# Patient Record
Sex: Female | Born: 1940 | Race: White | Hispanic: No
Health system: Midwestern US, Community
[De-identification: ages and names within clinical notes are randomized; demographics above are authoritative.]

## PROBLEM LIST (undated history)

## (undated) DIAGNOSIS — I2699 Other pulmonary embolism without acute cor pulmonale: Secondary | ICD-10-CM

## (undated) DIAGNOSIS — I4891 Unspecified atrial fibrillation: Secondary | ICD-10-CM

## (undated) DIAGNOSIS — N289 Disorder of kidney and ureter, unspecified: Secondary | ICD-10-CM

## (undated) HISTORY — PX: CHOLECYSTECTOMY: SHX55

## (undated) HISTORY — PX: APPENDECTOMY: SHX54

---

## 2016-07-08 ENCOUNTER — Emergency Department (HOSPITAL_COMMUNITY): Payer: Medicare Other

## 2016-07-08 ENCOUNTER — Encounter (HOSPITAL_COMMUNITY): Payer: Self-pay

## 2016-07-08 ENCOUNTER — Emergency Department (HOSPITAL_COMMUNITY)
Admission: EM | Admit: 2016-07-08 | Discharge: 2016-07-08 | Disposition: A | Discharge status: Home / Self Care | Payer: Medicare Other | Attending: Emergency Medicine | Admitting: Emergency Medicine

## 2016-07-08 DIAGNOSIS — W06XXXA Fall from bed, initial encounter: Secondary | ICD-10-CM | POA: Diagnosis not present

## 2016-07-08 DIAGNOSIS — S42302A Unspecified fracture of shaft of humerus, left arm, initial encounter for closed fracture: Secondary | ICD-10-CM

## 2016-07-08 DIAGNOSIS — Y999 Unspecified external cause status: Secondary | ICD-10-CM | POA: Diagnosis not present

## 2016-07-08 DIAGNOSIS — Y9389 Activity, other specified: Secondary | ICD-10-CM | POA: Insufficient documentation

## 2016-07-08 DIAGNOSIS — Y929 Unspecified place or not applicable: Secondary | ICD-10-CM | POA: Diagnosis not present

## 2016-07-08 DIAGNOSIS — M9701XA Periprosthetic fracture around internal prosthetic right hip joint, initial encounter: Secondary | ICD-10-CM

## 2016-07-08 DIAGNOSIS — S42292A Other displaced fracture of upper end of left humerus, initial encounter for closed fracture: Secondary | ICD-10-CM

## 2016-07-08 DIAGNOSIS — R51 Headache: Secondary | ICD-10-CM | POA: Diagnosis not present

## 2016-07-08 DIAGNOSIS — S4992XA Unspecified injury of left shoulder and upper arm, initial encounter: Secondary | ICD-10-CM | POA: Diagnosis present

## 2016-07-08 DIAGNOSIS — T1490XA Injury, unspecified, initial encounter: Secondary | ICD-10-CM

## 2016-07-08 HISTORY — DX: Other pulmonary embolism without acute cor pulmonale: I26.99

## 2016-07-08 HISTORY — DX: Unspecified atrial fibrillation: I48.91

## 2016-07-08 MED ORDER — OXYCODONE-ACETAMINOPHEN 5-325 MG PO TABS
1.0000 | ORAL_TABLET | Freq: Once | ORAL | Status: AC
  Administered 2016-07-08: 1 via ORAL
  Filled 2016-07-08: qty 1

## 2016-07-08 MED ORDER — ACETAMINOPHEN 325 MG PO TABS
650.0000 mg | ORAL_TABLET | Freq: Once | ORAL | Status: AC
  Administered 2016-07-08: 650 mg via ORAL
  Filled 2016-07-08: qty 2

## 2016-07-08 MED ORDER — OXYCODONE-ACETAMINOPHEN 5-325 MG PO TABS: 1.0000 | ORAL_TABLET | Freq: Three times a day (TID) | ORAL | 0 refills | Status: AC | PRN

## 2016-07-08 MED ORDER — ACETAMINOPHEN 325 MG PO TABS: 650.0000 mg | ORAL_TABLET | Freq: Three times a day (TID) | ORAL | 0 refills | Status: AC | PRN

## 2016-07-08 NOTE — Discharge Instructions (Addendum)
Keep brace intact and dry.  No lifting, pushing, pulling with left arm.

## 2016-07-08 NOTE — Consult Note (Signed)
Reason for Consult:Left humerus fx Referring Physician: Dominic Rhome Strebel is an 76 y.o. female.  HPI: Carrie Bowman was sitting on the side of a high bed when she started sliding off of a very slick comforter and could not catch herself. She fell and thinks she caught herself on her outstretched left arm. It was very close to a wall though and that might have hit it as well. She hit her head but did not lose consciousness or have any concussive symptoms. She c/o left arm pain with paresthesias from mid-upper to mid-lower arm. She is visiting from the [place] area helping to take care of her brother.  Past Medical History:  Diagnosis Date  . Atrial fibrillation (HCC)   . Pulmonary embolism [facility])     Past Surgical History:  Procedure Laterality Date  . APPENDECTOMY    . CHOLECYSTECTOMY      History reviewed. No pertinent family history.  Social History:  reports that she has never smoked. She does not have any smokeless tobacco history on file. She reports that she does not drink alcohol or use drugs.  Allergies: Allergies not on file  Medications: I have reviewed the patient's current medications.  No results found for this or any previous visit (from the past 48 hour(s)).  Dg Shoulder Left  Result Date: 07/08/2016 CLINICAL DATA:  Fall today with left shoulder pain, initial encounter EXAM: LEFT SHOULDER - 2+ VIEW COMPARISON:  None. FINDINGS: Degenerative changes of the acromioclavicular joint are noted. Midshaft left humeral fracture is seen with 1/2 bone width displacement of the distal fracture fragment laterally. No other fractures are seen. IMPRESSION: Midshaft left humeral fracture. Electronically Signed   By: Alcide Clever M.D.   On: 07/08/2016 13:25    Review of Systems  Constitutional: Negative for weight loss.  HENT: Negative for ear discharge, ear pain, hearing loss and tinnitus.   Eyes: Negative for blurred vision, double vision, photophobia and pain.   Respiratory: Negative for cough, sputum production and shortness of breath.   Cardiovascular: Negative for chest pain.  Gastrointestinal: Negative for abdominal pain, nausea and vomiting.  Genitourinary: Negative for dysuria, flank pain, frequency and urgency.  Musculoskeletal: Positive for myalgias (Left arm). Negative for back pain, falls, joint pain and neck pain.  Neurological: Positive for tingling and headaches. Negative for dizziness, sensory change, focal weakness and loss of consciousness.  Endo/Heme/Allergies: Does not bruise/bleed easily.  Psychiatric/Behavioral: Negative for depression, memory loss and substance abuse. The patient is not nervous/anxious.    Blood pressure (!) 127/57, pulse (!) 55, temperature 98 F (36.7 C), temperature source Oral, resp. rate 16, SpO2 95 %. Physical Exam  Constitutional: She appears well-developed and well-nourished. No distress.  HENT:  Head: Normocephalic and atraumatic.  Eyes: Conjunctivae are normal. Right eye exhibits no discharge. Left eye exhibits no discharge. No scleral icterus.  Neck: Neck supple.  Cardiovascular: Regular rhythm.  Bradycardia present.   Respiratory: Effort normal. No respiratory distress.  Musculoskeletal:  Right shoulder, elbow, wrist, digits- no skin wounds, nontender, no instability, no blocks to motion  Sens  Ax/R/M/U intact  Mot   Ax/ R/ PIN/ M/ AIN/ U intact  Rad 2+  Left shoulder, elbow, wrist, digits- no skin wounds, upper arm TTP  Sens  Ax/R/M/U intact but paresthetic  Mot   Ax/ R/ PIN/ M/ AIN/ U intact  Rad 2+  LLE No traumatic wounds, ecchymosis, or rash  Nontender  No effusions  Knee stable to varus/ valgus and anterior/posterior stress  Sens DPN, SPN, TN intact  Motor EHL, ext, flex, evers 5/5  DP 2+, PT 2+, No significant edema   RLE No traumatic wounds, ecchymosis, or rash  Nontender  No effusions  Knee stable to varus/ valgus and anterior/posterior stress  Sens DPN, SPN, TN  intact  Motor EHL, ext, flex, evers 5/5  DP 2+, PT 2+, No significant edema     Lymphadenopathy:    She has no cervical adenopathy.  Neurological: She is alert.  Skin: Skin is warm and dry. She is not diaphoretic.  Psychiatric: She has a normal mood and affect. Her behavior is normal.    Assessment/Plan: Left closed transverse humerus fx -- Will treat initially non-operatively in coap splint. F/u with Dr. Duwayne HeckJason Rogers in his office in 1-2 weeks.    Freeman CaldronMichael J. Ramsie Ostrander, PA-C Orthopedic Surgery [phone] 07/08/2016, 2:13 PM

## 2016-07-08 NOTE — ED Notes (Signed)
Pt stable, understands discharge instructions, and reasons for return.   

## 2016-07-08 NOTE — ED Notes (Signed)
Patient transported to CT 

## 2016-07-08 NOTE — Progress Notes (Signed)
Orthopedic Tech Progress Note Patient Details:  Carrie GroutJacquelyn Bowman [birth year] [id number]  Ortho Devices Type of Ortho Device: Coapt Ortho Device/Splint Location: LUE Ortho Device/Splint Interventions: Ordered, Application   Jennye MoccasinHughes, Yavuz Kirby Craig 07/08/2016, 3:30 PM

## 2016-07-08 NOTE — ED Triage Notes (Signed)
Pt brought in by EMS due to slipping off of bed. Pt hit the wall and floor after slipping off of bed. Pt is on eliquis. Pt has obvious deformity to left shoulder and possibly left wrist. Pt a&ox4. Pt denies neck pain and LOC.

## 2016-07-10 NOTE — ED Provider Notes (Signed)
[facility]-EMERGENCY DEPT Provider Note   CSN: [id number] Arrival date & time: 07/08/16  1256     History   Chief Complaint Chief Complaint  Patient presents with  . Shoulder Injury    HPI Carrie Bowman is a 76 y.o. female.  HPI Pt with hx of AF not on anticoagulation comes from home after having a fall. PT had a mechanical fall, and fell onto outstretched arm. Pt has pain to the L upper extremity. She has no numbness, tingling. No n/v/f/c.  Past Medical History:  Diagnosis Date  . Atrial fibrillation (HCC)   . Pulmonary embolism (HCC)     There are no active problems to display for this patient.   Past Surgical History:  Procedure Laterality Date  . APPENDECTOMY    . CHOLECYSTECTOMY      OB History    No data available       Home Medications    Prior to Admission medications   Medication Sig Start Date End Date Taking? Authorizing Provider  acetaminophen (TYLENOL) 325 MG tablet Take 2 tablets (650 mg total) by mouth every 8 (eight) hours as needed. 07/08/16   Derwood KaplanAnkit Darbi Chandran, MD  oxyCODONE-acetaminophen (PERCOCET/ROXICET) 5-325 MG tablet Take 1 tablet by mouth every 8 (eight) hours as needed for severe pain. 07/08/16   Derwood KaplanAnkit Temiloluwa Laredo, MD    Family History History reviewed. No pertinent family history.  Social History Social History  Substance Use Topics  . Smoking status: Never Smoker  . Smokeless tobacco: Not on file  . Alcohol use No     Allergies   Patient has no allergy information on record.   Review of Systems Review of Systems  ROS 10 Systems reviewed and are negative for acute change except as noted in the HPI.     Physical Exam Updated Vital Signs BP (!) 118/52   Pulse (!) 57   Temp 98 F (36.7 C) (Oral)   Resp 20   SpO2 92%   Physical Exam  Constitutional: She is oriented to person, place, and time. She appears well-developed.  HENT:  Head: Normocephalic and atraumatic.  Eyes: EOM are normal.  Neck: Normal range of motion.  Neck supple.  No midline c-spine tenderness, pt able to turn head to 45 degrees bilaterally without any pain and able to flex neck to the chest and extend without any pain or neurologic symptoms.   Cardiovascular: Normal rate and intact distal pulses.   Pulmonary/Chest: Effort normal.  Abdominal: Bowel sounds are normal.  Musculoskeletal:  LUE deformity and tenderness to palpation  Neurological: She is alert and oriented to person, place, and time. No sensory deficit.  Skin: Skin is warm and dry.  Nursing note and vitals reviewed.    ED Treatments / Results  Labs (all labs ordered are listed, but only abnormal results are displayed) Labs Reviewed - No data to display  EKG  EKG Interpretation None       Radiology Dg Humerus Left  Result Date: 07/08/2016 CLINICAL DATA:  Left humeral fracture post splinting EXAM: LEFT HUMERUS - 2+ VIEW COMPARISON:  07/08/2016 FINDINGS: Two views of the left humerus submitted. There are artifacts from splinting material. Again noted mild distracted minimal angulated fracture mid shaft of the left humerus. IMPRESSION: There are artifacts from splinting material. Again noted mild distracted minimal angulated fracture mid shaft of the left humerus. Electronically Signed   By: Natasha MeadLiviu  Pop M.D.   On: 07/08/2016 16:20    Procedures Procedures (including critical care time)  Medications Ordered in ED Medications  oxyCODONE-acetaminophen (PERCOCET/ROXICET) 5-325 MG per tablet 1 tablet (1 tablet Oral Given 07/08/16 1522)  acetaminophen (TYLENOL) tablet 650 mg (650 mg Oral Given 07/08/16 1522)  oxyCODONE-acetaminophen (PERCOCET/ROXICET) 5-325 MG per tablet 1 tablet (1 tablet Oral Given 07/08/16 1729)     Initial Impression / Assessment and Plan / ED Course  I have reviewed the triage vital signs and the nursing notes.  Pertinent labs & imaging results that were available during my care of the patient were reviewed by me and considered in my medical  decision making (see chart for details).     Pt with midshaft humerus fracture with displacement - Ortho consulted.  Final Clinical Impressions(s) / ED Diagnoses   Final diagnoses:  Humerus head fracture, left, closed, initial encounter    New Prescriptions Discharge Medication List as of 07/08/2016  5:22 PM    START taking these medications   Details  acetaminophen (TYLENOL) 325 MG tablet Take 2 tablets (650 mg total) by mouth every 8 (eight) hours as needed., Starting Tue 07/08/2016, Print    oxyCODONE-acetaminophen (PERCOCET/ROXICET) 5-325 MG tablet Take 1 tablet by mouth every 8 (eight) hours as needed for severe pain., Starting Tue 07/08/2016, Print         Derwood Kaplan, MD 07/10/16 [phone]

## 2018-01-06 IMAGING — DX DG HUMERUS 2V *L*
2 series · 2 of 2 positions shown · non-contrast
Comparison: Plain films left shoulder earlier today.

CLINICAL DATA: Left upper arm pain since falling out of bed today.
Initial encounter.

EXAM:
LEFT HUMERUS - 2+ VIEW

[humerus ap]
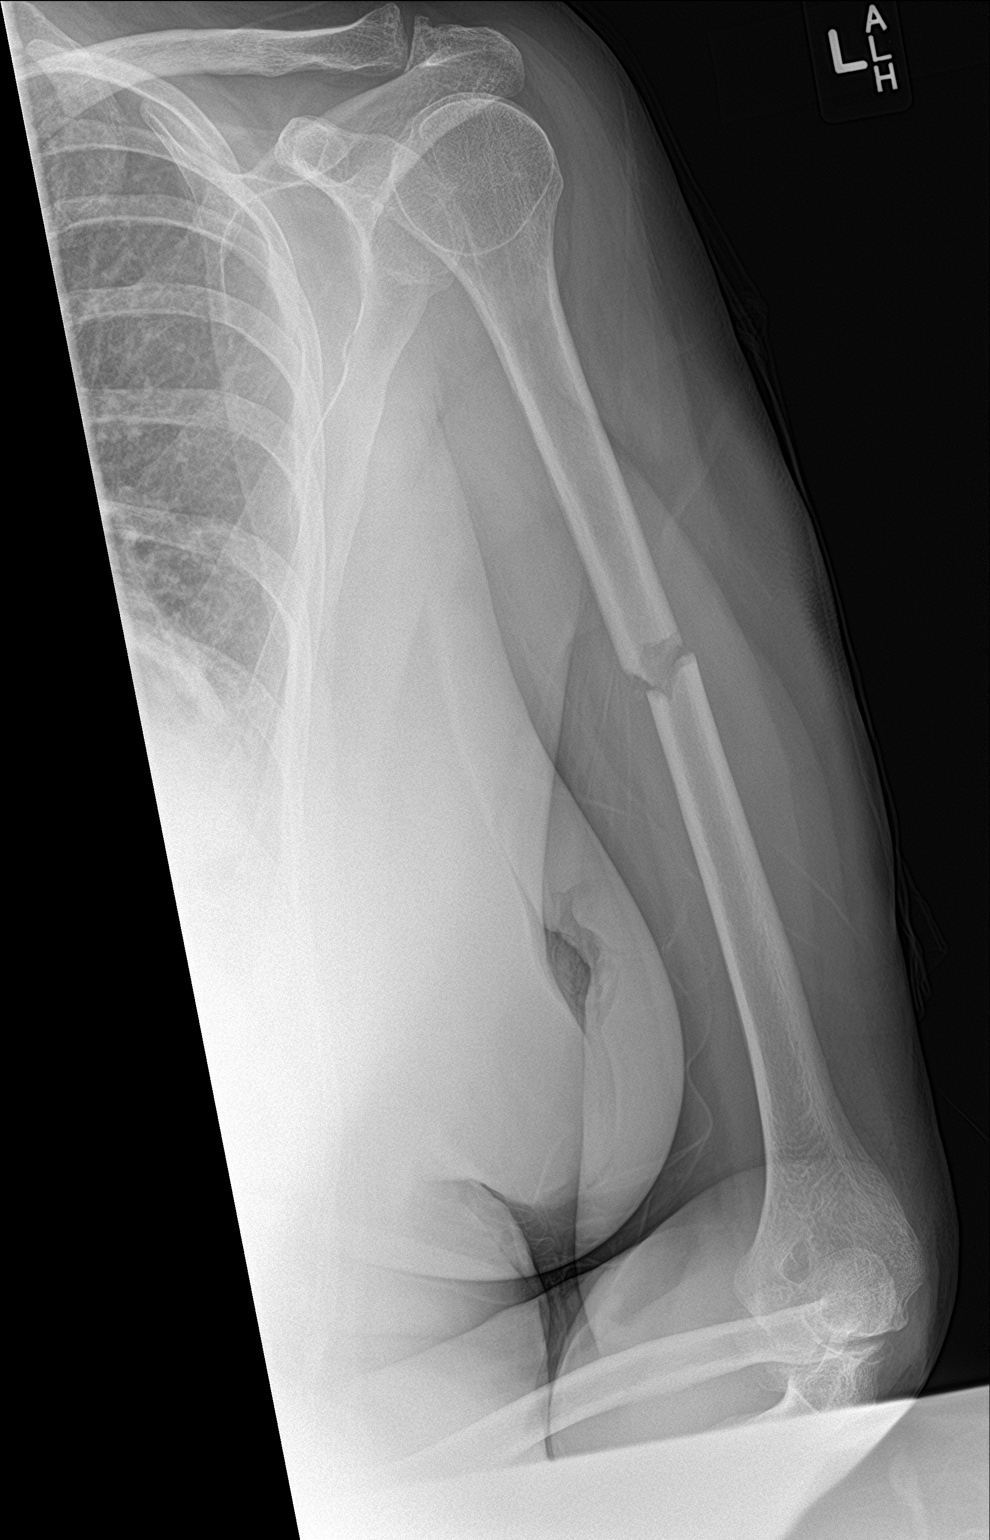

[humerus lat]
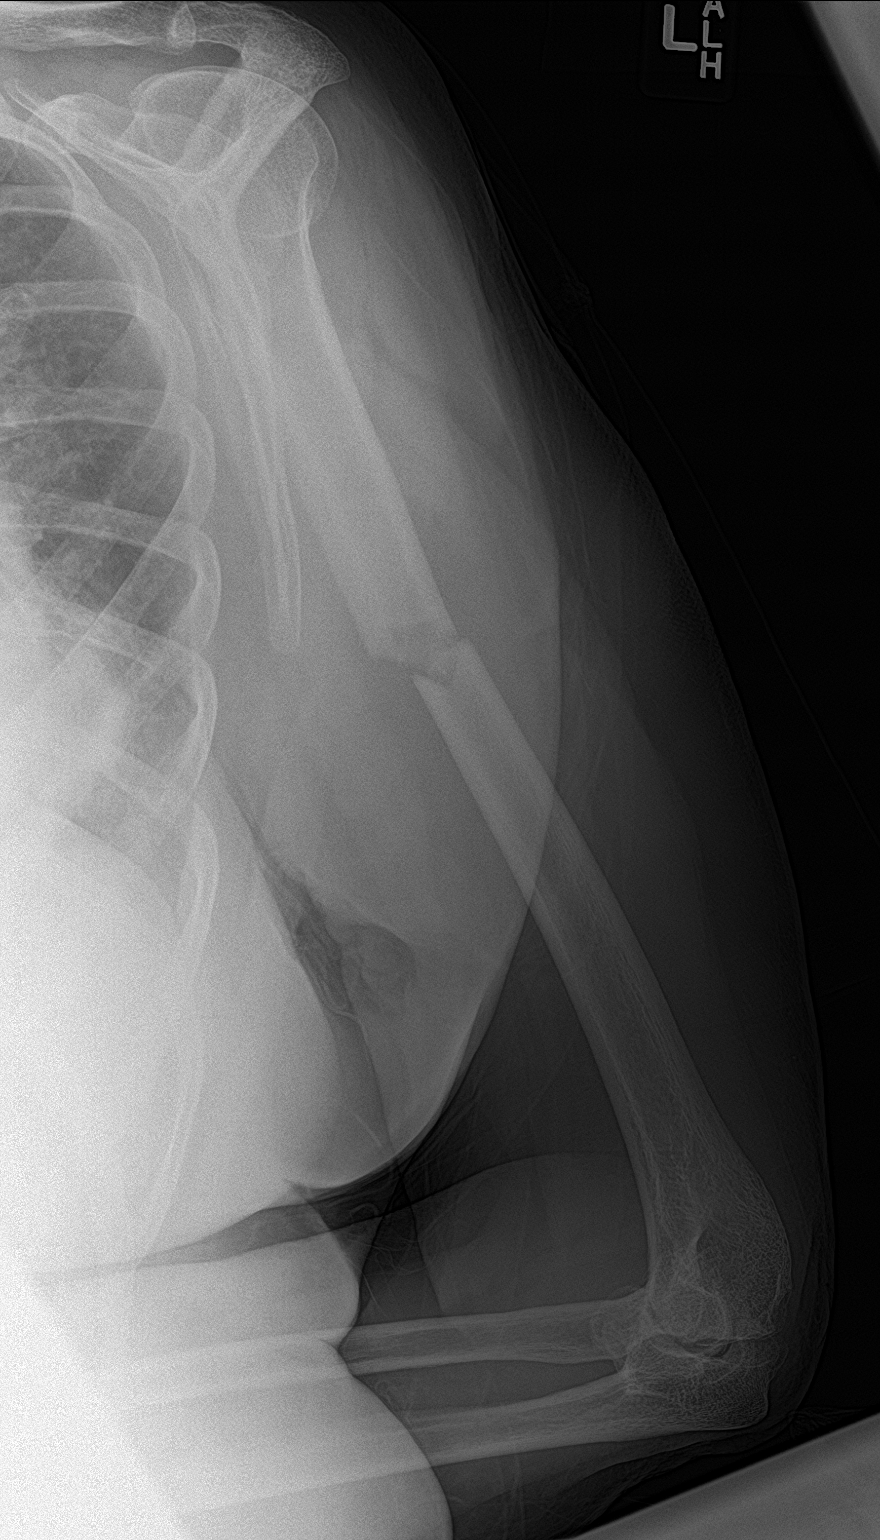

[2 of 2 positions shown; findings below may reference images not displayed]

FINDINGS: Transverse mid diaphyseal fracture left humerus is identified. The
fracture is mildly distracted. No other abnormality is identified.
IMPRESSION: Transverse mid diaphyseal fracture left humerus.

## 2018-01-06 IMAGING — DX DG SHOULDER 2+V*L*
2 series · 2 of 2 positions shown · non-contrast
Comparison: None.

CLINICAL DATA: Fall today with left shoulder pain, initial
encounter

EXAM:
LEFT SHOULDER - 2+ VIEW

[x shoulder ap left]
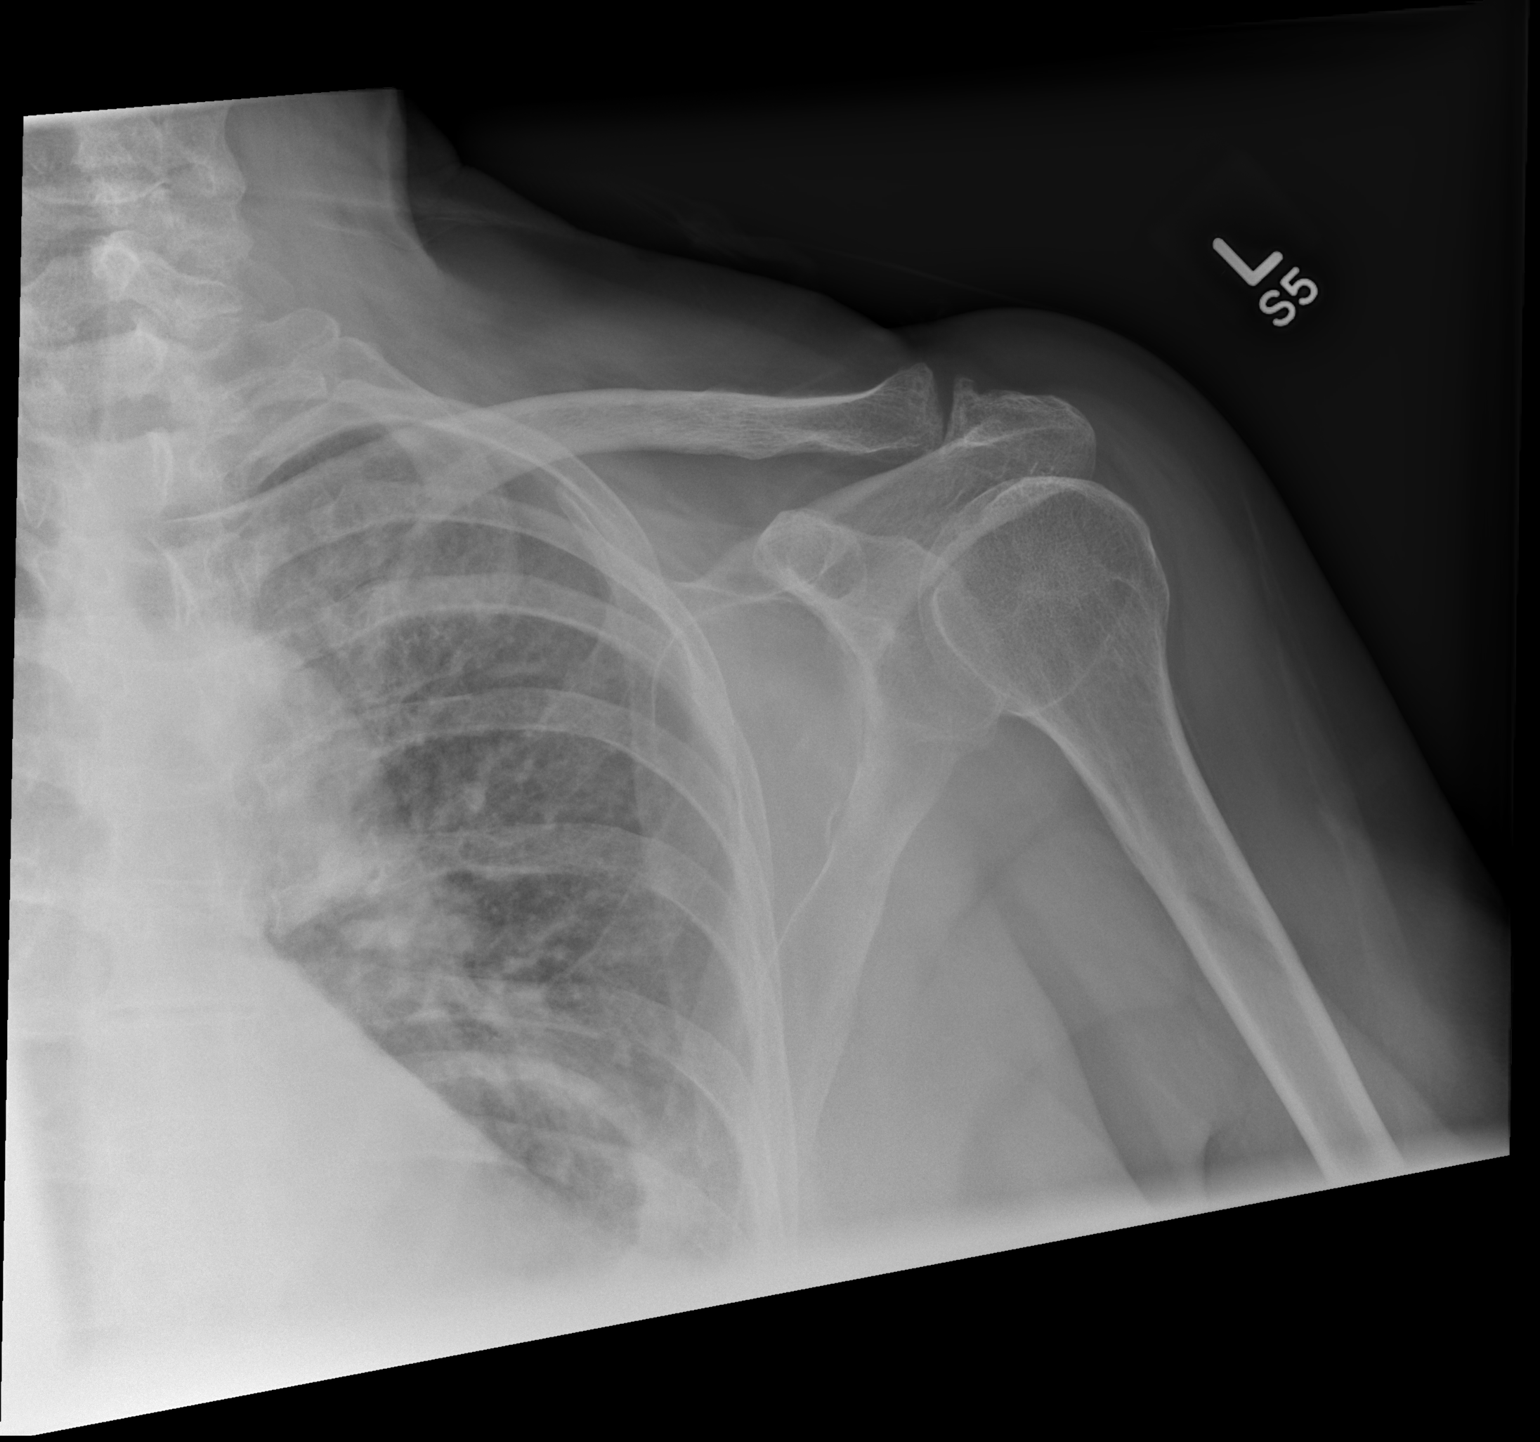

[x scapula y-view left]
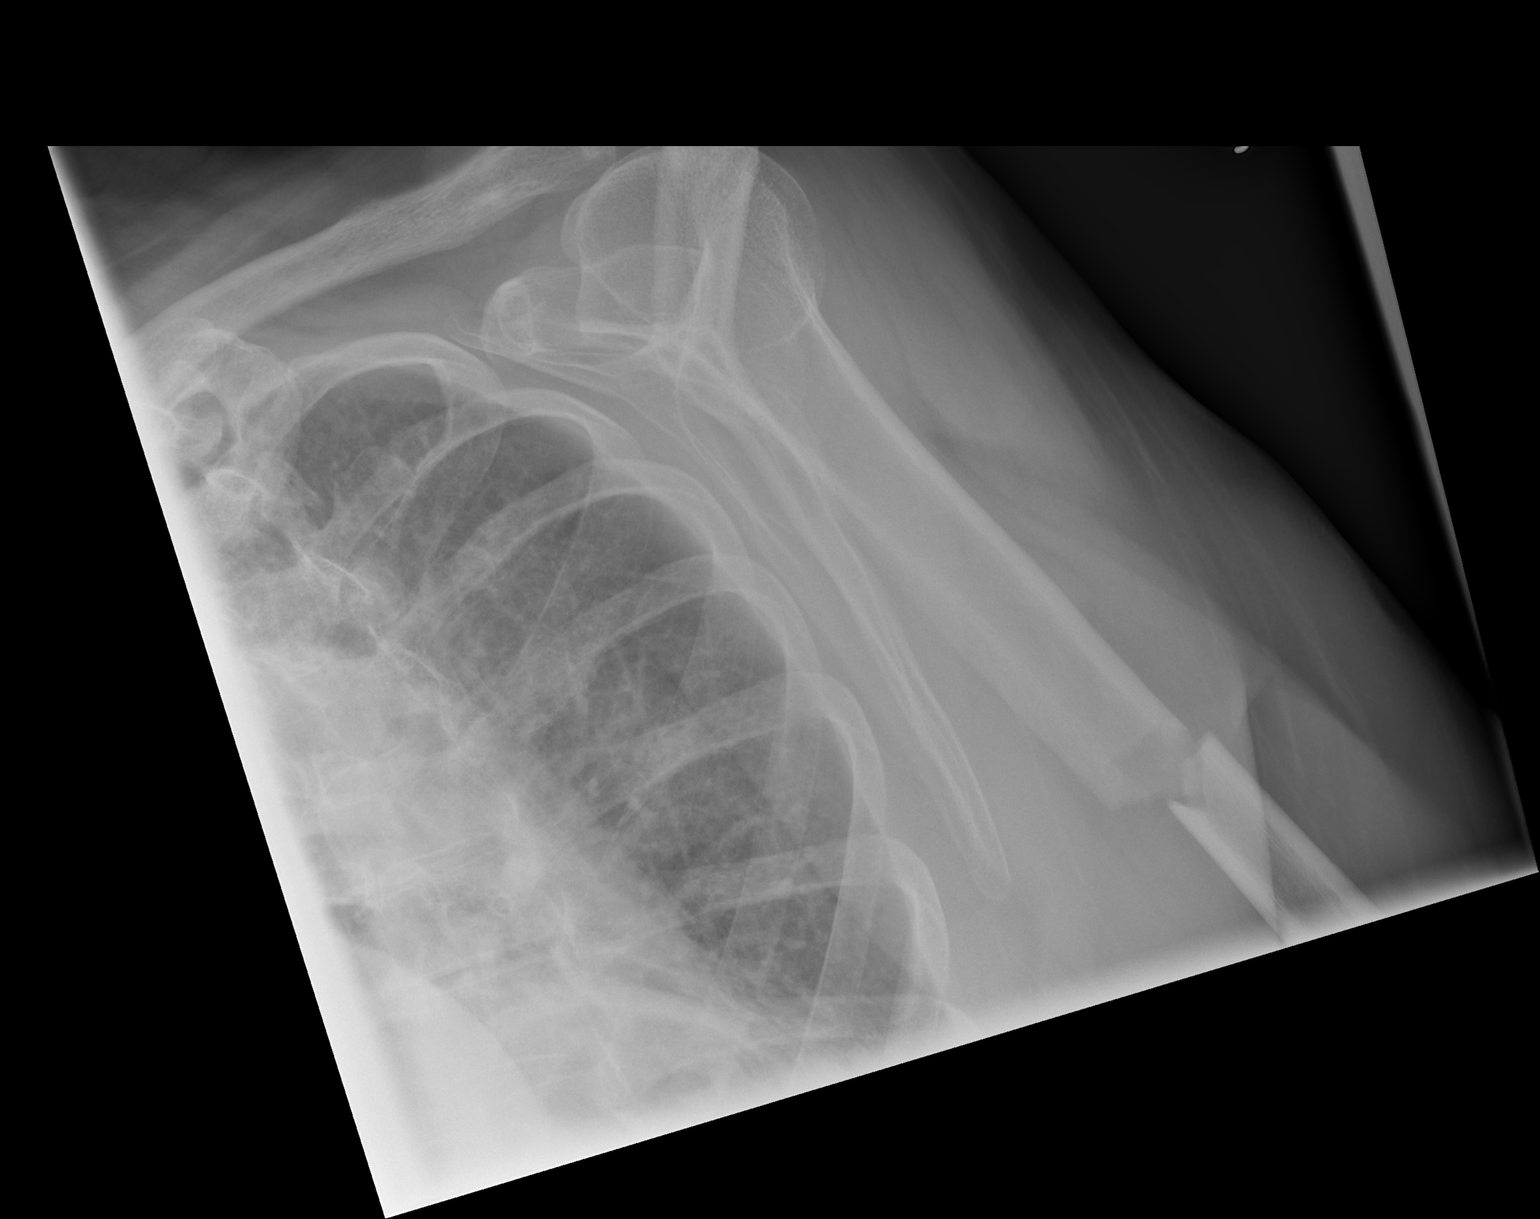

[2 of 2 positions shown; findings below may reference images not displayed]

FINDINGS: Degenerative changes of the acromioclavicular joint are noted.
Midshaft left humeral fracture is seen with [DATE] bone width
displacement of the distal fracture fragment laterally. No other
fractures are seen.
IMPRESSION: Midshaft left humeral fracture.

## 2018-03-15 ENCOUNTER — Inpatient Hospital Stay
Admit: 2018-03-15 | Discharge: 2018-03-16 | Disposition: A | Discharge status: Home / Self Care | Payer: MEDICARE | Attending: Emergency Medicine

## 2018-03-15 ENCOUNTER — Emergency Department: Admit: 2018-03-15 | Payer: MEDICARE | Primary: Family Medicine

## 2018-03-15 DIAGNOSIS — R55 Syncope and collapse: Secondary | ICD-10-CM

## 2018-03-15 LAB — POC URINE MACROSCOPIC
Bilirubin, Urine: NEGATIVE
Bilirubin: NEGATIVE
Blood, Urine: NEGATIVE
Blood: NEGATIVE
Glucose, Ur: NEGATIVE mg/dl
Glucose: NEGATIVE mg/dl
Ketone: NEGATIVE mg/dl
Ketones, Urine: NEGATIVE mg/dl
Leukocyte Esterase, Urine: NEGATIVE
Leukocyte Esterase: NEGATIVE
Nitrite, Urine: NEGATIVE
Nitrites: NEGATIVE
Protein, UA: NEGATIVE mg/dl
Protein: NEGATIVE mg/dl
Specific Gravity, UA: 1.02 (ref 1.005–1.030)
Specific gravity: 1.02 (ref 1.005–1.030)
Urobilinogen, UA, POCT: 0.2 EU/dl (ref 0.0–1.0)
Urobilinogen: 0.2 EU/dl (ref 0.0–1.0)
pH (UA): 5 (ref 5–9)
pH, UA: 5 (ref 5–9)

## 2018-03-15 LAB — COMPREHENSIVE METABOLIC PANEL
ALT: 30 U/L (ref 12–78)
AST: 17 U/L (ref 15–37)
Albumin: 3.3 gm/dl — ABNORMAL LOW (ref 3.4–5.0)
Alkaline Phosphatase: 96 U/L (ref 45–117)
Anion Gap: 5 mmol/L (ref 5–15)
BUN: 20 mg/dl (ref 7–25)
CO2: 29 mEq/L (ref 21–32)
Calcium: 9 mg/dl (ref 8.5–10.1)
Chloride: 103 mEq/L (ref 98–107)
Creatinine: 0.9 mg/dl (ref 0.6–1.3)
EGFR IF NonAfrican American: >60
GFR African American: >60
Glucose: 100 mg/dl (ref 74–106)
Potassium: 4.4 mEq/L (ref 3.5–5.1)
Sodium: 137 mEq/L (ref 136–145)
Total Bilirubin: 0.3 mg/dl (ref 0.2–1.0)
Total Protein: 6.6 gm/dl (ref 6.4–8.2)

## 2018-03-15 LAB — CBC WITH AUTO DIFFERENTIAL
Basophils %: 0.4 % (ref 0–3)
Eosinophils %: 1.3 % (ref 0–5)
Hematocrit: 41.4 % (ref 37.0–50.0)
Hemoglobin: 13.2 gm/dl (ref 13.0–17.2)
Immature Granulocytes: 0.3 % (ref 0.0–3.0)
Lymphocytes %: 57.2 % — ABNORMAL HIGH (ref 28–48)
MCH: 30.5 pg (ref 25.4–34.6)
MCHC: 31.9 gm/dl (ref 30.0–36.0)
MCV: 95.6 fL (ref 80.0–98.0)
MPV: 10.6 fL — ABNORMAL HIGH (ref 6.0–10.0)
Monocytes %: 6 % (ref 1–13)
Neutrophils %: 34.8 % (ref 34–64)
Nucleated RBCs: 0 (ref 0–0)
Platelets: 231 10*3/uL (ref 140–450)
RBC: 4.33 M/uL (ref 3.60–5.20)
RDW-SD: 49.6 — ABNORMAL HIGH (ref 36.4–46.3)
WBC: 10.4 10*3/uL (ref 4.0–11.0)

## 2018-03-15 LAB — TROPONIN: Troponin I: <0.015 ng/ml (ref 0.000–0.045)

## 2018-03-15 LAB — METABOLIC PANEL, COMPREHENSIVE
ALT (SGPT): 30 U/L (ref 12–78)
AST (SGOT): 17 U/L (ref 15–37)
Albumin: 3.3 gm/dl — ABNORMAL LOW (ref 3.4–5.0)
Alk. phosphatase: 96 U/L (ref 45–117)
Anion gap: 5 mmol/L (ref 5–15)
BUN: 20 mg/dl (ref 7–25)
Bilirubin, total: 0.3 mg/dl (ref 0.2–1.0)
CO2: 29 mEq/L (ref 21–32)
Calcium: 9 mg/dl (ref 8.5–10.1)
Chloride: 103 mEq/L (ref 98–107)
Creatinine: 0.9 mg/dl (ref 0.6–1.3)
GFR est AA: >60
GFR est non-AA: >60
Glucose: 100 mg/dl (ref 74–106)
Potassium: 4.4 mEq/L (ref 3.5–5.1)
Protein, total: 6.6 gm/dl (ref 6.4–8.2)
Sodium: 137 mEq/L (ref 136–145)

## 2018-03-15 LAB — CBC WITH AUTOMATED DIFF
BASOPHILS: 0.4 % (ref 0–3)
EOSINOPHILS: 1.3 % (ref 0–5)
HCT: 41.4 % (ref 37.0–50.0)
HGB: 13.2 gm/dl (ref 13.0–17.2)
IMMATURE GRANULOCYTES: 0.3 % (ref 0.0–3.0)
LYMPHOCYTES: 57.2 % — ABNORMAL HIGH (ref 28–48)
MCH: 30.5 pg (ref 25.4–34.6)
MCHC: 31.9 gm/dl (ref 30.0–36.0)
MCV: 95.6 fL (ref 80.0–98.0)
MONOCYTES: 6 % (ref 1–13)
MPV: 10.6 fL — ABNORMAL HIGH (ref 6.0–10.0)
NEUTROPHILS: 34.8 % (ref 34–64)
NRBC: 0 (ref 0–0)
PLATELET: 231 10*3/uL (ref 140–450)
RBC: 4.33 M/uL (ref 3.60–5.20)
RDW-SD: 49.6 — ABNORMAL HIGH (ref 36.4–46.3)
WBC: 10.4 10*3/uL (ref 4.0–11.0)

## 2018-03-15 LAB — TROPONIN I: Troponin-I: <0.015 ng/ml (ref 0.000–0.045)

## 2018-03-15 MED ORDER — SODIUM CHLORIDE 0.9% BOLUS IV
0.9 % | INTRAVENOUS | Status: AC
Start: 2018-03-15 — End: 2018-03-15
  Administered 2018-03-15: 23:00:00 via INTRAVENOUS

## 2018-03-15 MED ORDER — SODIUM CHLORIDE 0.9 % IJ SYRG
Freq: Once | INTRAMUSCULAR | Status: DC
Start: 2018-03-15 — End: 2018-03-15

## 2018-03-15 NOTE — ED Notes (Signed)
The mid-level provider and I discussed the patient's history, physical exam, differential diagnosis, and ancillary information.  I personally saw and examined the patient. I have reviewed and agree with the midlevel findings, including all diagnostic interpretations, and plans as written. I was present during the key portions of separately billed procedures.  Portions of this chart were created with Dragon medical speech to text program.   Unrecognized errors may be present.      Patient with dizziness described as a wave coming from LEFT to RIGHT, last for 3-8 seconds as timed out by her daughter in the room.  She has no chest pain shortness of breath palpitations during these events, she denies any vertiginous type dizziness she does have old appearing right-sided V1 shingles, no TM involvement.  Nonspecific presentation, unchanged with fluids, labs were unremarkable.  She was concerned that this may be related to the acyclovir that she is currently taking, she is tolerating Valtrex in the past, will write a prescription for Valtrex follow up with PCP strict return precautions

## 2018-03-15 NOTE — ED Provider Notes (Signed)
[facility]  Emergency Department Treatment Report        Patient: Monica Reyes Age: 77 y.o. Sex: female    Date of Birth: 1940-08-04 Admit Date: 03/15/2018 PCP: Candi Leash, MD   MRN: [id number]  CSN: [id number]  Attending: Tawanna Cooler, MD   Room: ER09/ER09 Time Dictated: 5:51 PM APP: Chrystine Oiler       Chief Complaint   Chief Complaint   Patient presents with   ??? Dizziness     History of Present Illness     This is a 77 y.o. female with episodes of presyncopal dizziness occurring over the last month, more frequent in the last 2 days happening nearly 3 or 4 times a day.  She states after this point it was not daily, but now because it keeps occurring she thinks it may be related to the antivirals she had been taking for the 2 episodes of shingles she has had to the right upper face in the last month.  She states what she feels, sometimes when she is leaning forward sometimes when she is sitting or standing, with no provoking or relieving factors that she can tell, is a sensation like a wave of presyncopal dizziness crosses over her head lasting for 1 to 2 seconds.  She denies headache unilateral weakness slurred speech or facial droop, denies vertiginous dizziness, states she is currently asymptomatic.  As she was going to travel with her friend [employer], presents to be evaluated.    Review of Systems     Constitutional:  No fever, chills, or weight loss  Eyes: No visual symptoms.  ENT: No sore throat, runny nose or ear pain.  Respiratory: No cough, dyspnea or wheezing.  Cardiovascular: No chest pain, pressure, palpitations, tightness or heaviness.  Gastrointestinal: No vomiting, diarrhea or abdominal pain.  Genitourinary: No dysuria, frequency, or urgency.  Musculoskeletal: No joint pain or swelling.  Integumentary: No rashes.  Neurological: As above.  No headache unilateral weakness slurred speech or facial droop.    Past Medical/Surgical History    History reviewed. No pertinent past medical history.  History reviewed. No pertinent surgical history.    ATRIAL FIBRILLATION   ??? Depression   ??? Esophageal reflux   ??? Generalized osteoarthrosis, involving multiple sites   ??? Hypercholesteremia   ??? Migraine with aura, with intractable migraine, so stated, without mention of status migrainosus   ??? Other specified trigeminal nerve disorders   ??? Pulmonary embolus ([facility]) 04/14/2016   ??? Shingles   ??? Shortness of breath       Social History     Social History     Socioeconomic History   ??? Marital status: WIDOWED     Spouse name: Not on file   ??? Number of children: Not on file   ??? Years of education: Not on file   ??? Highest education level: Not on file   Occupational History   ??? Not on file   Social Needs   ??? Financial resource strain: Not on file   ??? Food insecurity:     Worry: Not on file     Inability: Not on file   ??? Transportation needs:     Medical: Not on file     Non-medical: Not on file   Tobacco Use   ??? Smoking status: Never Smoker   ??? Smokeless tobacco: Never Used   Substance and Sexual Activity   ??? Alcohol use: Not Currently   ???  Drug use: Never   ??? Sexual activity: Not on file   Lifestyle   ??? Physical activity:     Days per week: Not on file     Minutes per session: Not on file   ??? Stress: Not on file   Relationships   ??? Social connections:     Talks on phone: Not on file     Gets together: Not on file     Attends religious service: Not on file     Active member of club or organization: Not on file     Attends meetings of clubs or organizations: Not on file     Relationship status: Not on file   ??? Intimate partner violence:     Fear of current or ex partner: Not on file     Emotionally abused: Not on file     Physically abused: Not on file     Forced sexual activity: Not on file   Other Topics Concern   ??? Not on file   Social History Narrative   ??? Not on file       Family History   History reviewed. No pertinent family history.    Current Medications     None        Allergies     Allergies   Allergen Reactions   ??? Sulfa (Sulfonamide Antibiotics) Angioedema       Physical Exam     ED Triage Vitals [03/15/18 1449]   ED Encounter Vitals Group      BP 127/57      Pulse (Heart Rate) (!) 57      Resp Rate 16      Temp 99 ??F (37.2 ??C)      Temp src       O2 Sat (%) 96 %      Weight 220 lb      Height '5\' 9"'        Constitutional: Patient appears well developed and well nourished. Appearance and behavior are age and situation appropriate.  Eyes: Conjutivae clear, lids normal. Pupils equal,   symmetrical, and normally reactive.  HEENT: Ears/Nose: Hearing is grossly intact to voice. Internal and external examinations of the ears and nose are unremarkable.  PERRLA. Mucous membranes moist, non-erythematous. Surface of the pharynx, palate, and tongue are pink, moist and without lesions.  Neck: supple, non tender, symmetrical, no masses or JVD.   Respiratory: lungs clear to auscultation, nonlabored respirations. No tachypnea or accessory muscle use.  Cardiovascular: heart regular rate and rhythm without murmur rubs or gallops.   Calves soft and non-tender. Distal pulses 2+ and equal bilaterally.  No peripheral edema or significant variscosities.    Gastrointestinal:  Abdomen soft, nontender without complaint of pain to palpation  Musculoskeletal: Nail beds pink with prompt capillary refill  Integumentary: warm and dry without rashes or lesions  Neurologic: alert and oriented, Sensation intact, motor strength equal and symmetric.  No facial asymmetry or dysarthria.       Impression and Management Plan       Obtain screening studies for infectious, metabolic abnormalities, less likely ACS but patient is concerned about her heart so will obtain screening troponin.  Symptoms lasting for a month, no findings concerning for CVA on exam or by history.  No obvious attributable causes such as dehydration, lack of sleep, caffeine, stress, etc. by my history .      Procedures      Diagnostic Studies  LAB/IMAGING:   Results for orders placed or performed during the hospital encounter of 03/15/18   XR CHEST PA LAT    Narrative    Clinical history: Dizziness    EXAMINATION:  PA and lateral views of the chest 03/15/2018    Correlation: None    FINDINGS:  Trachea and heart size are within normal limits. Lungs are clear. Retrocardiac  hiatal hernia.      Impression    IMPRESSION:  1. No acute pulmonary process.  2. Hiatal hernia.     CBC WITH AUTOMATED DIFF   Result Value Ref Range    WBC 10.4 4.0 - 11.0 1000/mm3    RBC 4.33 3.60 - 5.20 M/uL    HGB 13.2 13.0 - 17.2 gm/dl    HCT 41.4 37.0 - 50.0 %    MCV 95.6 80.0 - 98.0 fL    MCH 30.5 25.4 - 34.6 pg    MCHC 31.9 30.0 - 36.0 gm/dl    PLATELET 231 140 - 450 1000/mm3    MPV 10.6 (H) 6.0 - 10.0 fL    RDW-SD 49.6 (H) 36.4 - 46.3      NRBC 0 0 - 0      IMMATURE GRANULOCYTES 0.3 0.0 - 3.0 %    NEUTROPHILS 34.8 34 - 64 %    LYMPHOCYTES 57.2 (H) 28 - 48 %    MONOCYTES 6.0 1 - 13 %    EOSINOPHILS 1.3 0 - 5 %    BASOPHILS 0.4 0 - 3 %   METABOLIC PANEL, COMPREHENSIVE   Result Value Ref Range    Sodium 137 136 - 145 mEq/L    Potassium 4.4 3.5 - 5.1 mEq/L    Chloride 103 98 - 107 mEq/L    CO2 29 21 - 32 mEq/L    Glucose 100 74 - 106 mg/dl    BUN 20 7 - 25 mg/dl    Creatinine 0.9 0.6 - 1.3 mg/dl    GFR est AA >60.0      GFR est non-AA >60      Calcium 9.0 8.5 - 10.1 mg/dl    AST (SGOT) 17 15 - 37 U/L    ALT (SGPT) 30 12 - 78 U/L    Alk. phosphatase 96 45 - 117 U/L    Bilirubin, total 0.3 0.2 - 1.0 mg/dl    Protein, total 6.6 6.4 - 8.2 gm/dl    Albumin 3.3 (L) 3.4 - 5.0 gm/dl    Anion gap 5 5 - 15 mmol/L   TROPONIN I   Result Value Ref Range    Troponin-I <0.015 0.000 - 0.045 ng/ml   POC URINE MACROSCOPIC   Result Value Ref Range    Glucose Negative NEGATIVE,Negative mg/dl    Bilirubin Negative NEGATIVE,Negative      Ketone Negative NEGATIVE,Negative mg/dl    Specific gravity 1.020 1.005 - 1.030      Blood Negative NEGATIVE,Negative      pH (UA) 5.0 5 - 9       Protein Negative NEGATIVE,Negative mg/dl    Urobilinogen 0.2 0.0 - 1.0 EU/dl    Nitrites Negative NEGATIVE,Negative      Leukocyte Esterase Negative NEGATIVE,Negative      Color Yellow      Appearance Clear         EKG  Dr. Veneta Penton did not see any acute S-T segment or T-wave abnormalities that are consistent with acute ischemia or infarction.    Medical Decision Making/ ED Course  Pt remained stable in the emergency department, did not develop other symptoms, informed of results, and discharged in stable condition.  Patient still asymptomatic.    Meds given:  Medications   sodium chloride (NS) flush 5-10 mL (has no administration in time range)   sodium chloride 0.9 % bolus infusion 1,000 mL (0 mL IntraVENous IV Completed 03/15/18 2005)       Final Diagnosis         ICD-10-CM ICD-9-CM   1. Near syncope R55 780.2       Disposition     Disposition and plan  Patient was discharged home in stable condition with discharge instructions on the same.     Return to the ER if condition worsens or new symptoms develop.   Follow up with primary care as discussed.     The patient was personally evaluated by myself and  Tawanna Cooler, MD who agrees with the above assessment and plan.    Dragon medical dictation software was used for portions of this report. Unintended errors may occur.     Porfirio Mylar, MPA, PA-C  March 15, 2018    My signature above authenticates this document and my orders, the final ??  diagnosis (es), discharge prescription (s), and instructions in the Epic ??  record.  If you have any questions please contact [phone].  ??  Nursing notes have been reviewed by the physician/ advanced practice ??  Clinician.

## 2018-03-15 NOTE — ED Notes (Signed)
Pt reports continued episodes of dizziness. Upon assessment pt B/P: 157/100. Pt states her B/p is not usually elevated. Pt states B/p was 127/52 earlier this afternoon with home blood pressure monitor.

## 2018-03-15 NOTE — ED Notes (Signed)
8:04 PM  03/15/18     Discharge instructions given to Meegan Hakimian (name) with verbalization of understanding. Patient accompanied by daughter.  Patient discharged with the following prescriptions Valtrex. Patient discharged to home (destination).      Kalisha E Cox, RN

## 2018-03-15 NOTE — ED Triage Notes (Signed)
Been on medication for shingles--  Has dizziness anyway    Thinks the medicine made the dizziness worse    Had a hard time driving

## 2018-03-15 NOTE — ED Provider Notes (Signed)
ED Provider Notes by Chesley Noon, PA-C at 03/15/18 1751                Author: Chesley Noon, PA-C  Service: EMERGENCY  Author Type: Physician Assistant       Filed: 03/15/18 2245  Date of Service: 03/15/18 1751  Status: Attested           Editor: Chesley Noon, PA-C (Physician Assistant)  Cosigner: Tawanna Cooler, MD at 03/15/18 2340          Attestation signed by Tawanna Cooler, MD at 03/15/18 2340          The mid-level provider and I discussed the patient's history, physical exam, differential diagnosis, and ancillary information.  I personally saw and examined  the patient. I have reviewed and agree with the midlevel findings, including all diagnostic interpretations, and plans as written. I was present during the key portions of separately billed procedures.      Additional documentation present in free text note.          Tawanna Cooler, [place]   Emergency Department Treatment Report                Patient: Monica Reyes  Age: 77 y.o.  Sex: female          Date of Birth: Dec 30, 1940  Admit Date: 03/15/2018  PCP: Candi Leash, MD         MRN: [id number]   CSN: [id number]   Attending: Tawanna Cooler, MD         Room: ER09/ER09  Time Dictated: 5:51 PM  APP: Chrystine Oiler           Chief Complaint      Chief Complaint       Patient presents with        ?  Dizziness          History of Present Illness        This is a 77 y.o. female with episodes  of presyncopal dizziness occurring over the last month, more frequent in the last 2 days happening nearly 3 or 4 times a day.  She states after this point it was not daily, but now because it keeps occurring she thinks it may be related to the antivirals  she had been taking for the 2 episodes of shingles she has had to the right upper face in the last month.  She states what she feels, sometimes when she is leaning forward sometimes when she is sitting or standing, with  no provoking or relieving factors  that she can tell, is a sensation like a wave of presyncopal dizziness crosses over her head lasting for 1 to 2 seconds.  She denies headache unilateral weakness slurred speech or facial droop, denies vertiginous dizziness, states she is currently asymptomatic.   As she was going to travel with her friend [employer], presents to be evaluated.        Review of Systems        Constitutional:  No fever, chills, or weight loss   Eyes: No visual symptoms.   ENT: No sore throat, runny nose or ear pain.   Respiratory: No cough, dyspnea or wheezing.  Cardiovascular: No chest pain, pressure, palpitations, tightness or heaviness.   Gastrointestinal: No vomiting, diarrhea or abdominal pain.   Genitourinary: No dysuria, frequency, or urgency.   Musculoskeletal: No joint pain or swelling.   Integumentary: No rashes.   Neurological: As above.  No headache unilateral weakness slurred speech or facial droop.        Past Medical/Surgical History     History reviewed. No pertinent past medical history.   History reviewed. No pertinent surgical history.      ATRIAL FIBRILLATION   ? Depression    ? Esophageal reflux   ? Generalized osteoarthrosis, involving multiple sites    ? Hypercholesteremia   ? Migraine with aura, with intractable migraine, so  stated, without mention of status migrainosus   ? Other specified trigeminal nerve disorders    ? Pulmonary embolus ([place]) 04/14/2016   ? Shingles    ? Shortness of breath            Social History          Social History          Socioeconomic History         ?  Marital status:  WIDOWED              Spouse name:  Not on file         ?  Number of children:  Not on file     ?  Years of education:  Not on file     ?  Highest education level:  Not on file       Occupational History        ?  Not on file       Social Needs         ?  Financial resource strain:  Not on file        ?  Food insecurity:              Worry:  Not on file          Inability:  Not on file        ?  Transportation needs:              Medical:  Not on file         Non-medical:  Not on file       Tobacco Use         ?  Smoking status:  Never Smoker     ?  Smokeless tobacco:  Never Used       Substance and Sexual Activity         ?  Alcohol use:  Not Currently     ?  Drug use:  Never     ?  Sexual activity:  Not on file       Lifestyle        ?  Physical activity:              Days per week:  Not on file         Minutes per session:  Not on file         ?  Stress:  Not on file       Relationships        ?  Social connections:              Talks on phone:  Not on file         Gets together:  Not on file  Attends religious service:  Not on file         Active member of club or organization:  Not on file         Attends meetings of clubs or organizations:  Not on file         Relationship status:  Not on file        ?  Intimate partner violence:              Fear of current or ex partner:  Not on file         Emotionally abused:  Not on file         Physically abused:  Not on file         Forced sexual activity:  Not on file        Other Topics  Concern        ?  Not on file       Social History Narrative        ?  Not on file             Family History     History reviewed. No pertinent family history.        Current Medications          None             Allergies          Allergies        Allergen  Reactions         ?  Sulfa (Sulfonamide Antibiotics)  Angioedema             Physical Exam          ED Triage Vitals [03/15/18 1449]     ED Encounter Vitals Group           BP  127/57        Pulse (Heart Rate)  (!) 57        Resp Rate  16        Temp  99 ??F (37.2 ??C)        Temp src          O2 Sat (%)  96 %        Weight  220 lb           Height  '5\' 9"'            Constitutional: Patient appears well developed and well nourished. Appearance and behavior are age and situation appropriate.   Eyes: Conjutivae clear, lids normal. Pupils equal,    symmetrical, and normally reactive.    HEENT: Ears/Nose: Hearing is grossly intact to voice. Internal and external examinations of the ears and nose are unremarkable.  PERRLA. Mucous  membranes moist, non-erythematous. Surface of the pharynx, palate, and tongue are pink, moist and without lesions.   Neck: supple, non tender, symmetrical, no masses or JVD.    Respiratory: lungs clear to auscultation, nonlabored respirations. No tachypnea or accessory muscle use.   Cardiovascular: heart regular rate and rhythm without murmur rubs or gallops.    Calves soft and non-tender. Distal pulses 2+ and equal bilaterally.  No peripheral edema or significant variscosities.     Gastrointestinal:  Abdomen soft, nontender without complaint of pain to palpation   Musculoskeletal: Nail beds pink with prompt capillary refill   Integumentary: warm and dry without rashes or lesions   Neurologic: alert and oriented, Sensation intact, motor strength equal and symmetric.  No facial  asymmetry or dysarthria.            Impression and Management Plan           Obtain screening studies for infectious, metabolic abnormalities, less likely ACS but patient is concerned about her heart so will obtain screening troponin.  Symptoms lasting for a month, no findings concerning for CVA on exam or by history.  No obvious  attributable causes such as dehydration, lack of sleep, caffeine, stress, etc. by my history .         Procedures           Diagnostic Studies        LAB/IMAGING:      Results for orders placed or performed during the hospital encounter of 03/15/18     XR CHEST PA LAT          Narrative       Clinical history: Dizziness      EXAMINATION:   PA and lateral views of the chest 03/15/2018      Correlation: None      FINDINGS:   Trachea and heart size are within normal limits. Lungs are clear. Retrocardiac   hiatal hernia.          Impression       IMPRESSION:   1. No acute pulmonary process.   2. Hiatal hernia.          CBC WITH AUTOMATED DIFF         Result  Value  Ref Range             WBC  10.4  4.0 - 11.0 1000/mm3       RBC  4.33  3.60 - 5.20 M/uL       HGB  13.2  13.0 - 17.2 gm/dl       HCT  41.4  37.0 - 50.0 %       MCV  95.6  80.0 - 98.0 fL       MCH  30.5  25.4 - 34.6 pg       MCHC  31.9  30.0 - 36.0 gm/dl       PLATELET  231  140 - 450 1000/mm3       MPV  10.6 (H)  6.0 - 10.0 fL       RDW-SD  49.6 (H)  36.4 - 46.3         NRBC  0  0 - 0         IMMATURE GRANULOCYTES  0.3  0.0 - 3.0 %       NEUTROPHILS  34.8  34 - 64 %       LYMPHOCYTES  57.2 (H)  28 - 48 %       MONOCYTES  6.0  1 - 13 %       EOSINOPHILS  1.3  0 - 5 %       BASOPHILS  0.4  0 - 3 %       METABOLIC PANEL, COMPREHENSIVE         Result  Value  Ref Range            Sodium  137  136 - 145 mEq/L       Potassium  4.4  3.5 - 5.1 mEq/L       Chloride  103  98 - 107 mEq/L       CO2  29  21 - 32 mEq/L  Glucose  100  74 - 106 mg/dl       BUN  20  7 - 25 mg/dl       Creatinine  0.9  0.6 - 1.3 mg/dl       GFR est AA  >60.0          GFR est non-AA  >60          Calcium  9.0  8.5 - 10.1 mg/dl       AST (SGOT)  17  15 - 37 U/L       ALT (SGPT)  30  12 - 78 U/L       Alk. phosphatase  96  45 - 117 U/L       Bilirubin, total  0.3  0.2 - 1.0 mg/dl       Protein, total  6.6  6.4 - 8.2 gm/dl       Albumin  3.3 (L)  3.4 - 5.0 gm/dl       Anion gap  5  5 - 15 mmol/L       TROPONIN I         Result  Value  Ref Range            Troponin-I  <0.015  0.000 - 0.045 ng/ml       POC URINE MACROSCOPIC         Result  Value  Ref Range            Glucose  Negative  NEGATIVE,Negative mg/dl       Bilirubin  Negative  NEGATIVE,Negative         Ketone  Negative  NEGATIVE,Negative mg/dl       Specific gravity  1.020  1.005 - 1.030         Blood  Negative  NEGATIVE,Negative         pH (UA)  5.0  5 - 9         Protein  Negative  NEGATIVE,Negative mg/dl       Urobilinogen  0.2  0.0 - 1.0 EU/dl       Nitrites  Negative  NEGATIVE,Negative         Leukocyte Esterase  Negative  NEGATIVE,Negative         Color  Yellow               Appearance  Clear               EKG   Dr. Veneta Penton did not see any acute S-T segment or T-wave abnormalities that are consistent with acute ischemia or infarction.        Medical Decision Making/ ED Course        Pt remained stable in the emergency department, did not develop other symptoms, informed of results, and discharged in stable condition.  Patient still asymptomatic.      Meds given:     Medications       sodium chloride (NS) flush 5-10 mL (has no administration in time range)       sodium chloride 0.9 % bolus infusion 1,000 mL (0 mL IntraVENous IV Completed 03/15/18 2005)             Final Diagnosis                    ICD-10-CM  ICD-9-CM          1.  Near syncope  R55  780.2  Disposition        Disposition and plan   Patient was discharged home in stable condition with discharge instructions on the same.       Return to the ER if condition worsens or new symptoms develop.    Follow up with primary care as discussed.       The patient was personally evaluated by myself and  Tawanna Cooler, MD who agrees with the above assessment and plan.      Dragon medical dictation software was used for portions of this report. Unintended errors may occur.       Porfirio Mylar, MPA, PA-C   March 15, 2018      My signature above authenticates this document and my orders, the final ??   diagnosis (es), discharge prescription (s), and instructions in the Epic ??   record.   If you have any questions please contact [phone].   ??   Nursing notes have been reviewed by the physician/ advanced practice ??   Clinician.

## 2018-03-15 NOTE — ED Notes (Signed)
Pt reports continued episodes of dizziness. Upon assessment pt B/P: 157/100. Pt states her B/p is not usually elevated. Pt states B/p was 127/52 earlier this afternoon with home blood pressure monitor.

## 2018-03-15 NOTE — ED Notes (Signed)
Been on medication for shingles--  Has dizziness anyway    Thinks the medicine made the dizziness worse    Had a hard time driving

## 2018-03-15 NOTE — ED Notes (Signed)
8:04 PM  03/15/18     Discharge instructions given to [facility] (name) with verbalization of understanding. Patient accompanied by daughter.  Patient discharged with the following prescriptions Valtrex. Patient discharged to home (destination).      Marisue Brooklyn, RN

## 2018-03-15 NOTE — ED Notes (Signed)
The mid-level provider and I discussed the patient's history, physical exam, differential diagnosis, and ancillary information.  I personally saw and examined the patient. I have reviewed and agree with the midlevel findings, including all diagnostic interpretations, and plans as written. I was present during the key portions of separately billed procedures.  Portions of this chart were created with Dragon medical speech to text program.   Unrecognized errors may be present.      Patient with dizziness described as a wave coming from LEFT to RIGHT, last for 3-8 seconds as timed out by her daughter in the room.  She has no chest pain shortness of breath palpitations during these events, she denies any vertiginous type dizziness she does have old appearing right-sided V1 shingles, no TM involvement.  Nonspecific presentation, unchanged with fluids, labs were unremarkable.  She was concerned that this may be related to the acyclovir that she is currently taking, she is tolerating Valtrex in the past, will write a prescription for Valtrex follow up with PCP strict return precautions

## 2018-03-16 LAB — EKG 12-LEAD
Atrial Rate: 54 {beats}/min
P Axis: 62 degrees
P-R Interval: 230 ms
Q-T Interval: 420 ms
QRS Duration: 90 ms
QTc Calculation (Bazett): 398 ms
R Axis: 19 degrees
T Axis: 44 degrees
Ventricular Rate: 54 {beats}/min

## 2018-03-16 LAB — EKG, 12 LEAD, INITIAL
Atrial Rate: 54 {beats}/min
Calculated P Axis: 62 degrees
Calculated R Axis: 19 degrees
Calculated T Axis: 44 degrees
P-R Interval: 230 ms
Q-T Interval: 420 ms
QRS Duration: 90 ms
QTC Calculation (Bezet): 398 ms
Ventricular Rate: 54 {beats}/min

## 2018-03-16 MED ORDER — VALACYCLOVIR 1 G TAB
1 gram | ORAL_TABLET | Freq: Three times a day (TID) | ORAL | 0 refills | Status: AC
Start: 2018-03-16 — End: 2018-03-22

## 2020-08-02 ENCOUNTER — Ambulatory Visit: Admit: 2020-08-02 | Discharge: 2020-08-02 | Attending: Physician Assistant | Primary: Family Medicine

## 2020-08-02 ENCOUNTER — Ambulatory Visit: Attending: Physician Assistant | Primary: Family Medicine

## 2020-08-02 DIAGNOSIS — N289 Disorder of kidney and ureter, unspecified: Secondary | ICD-10-CM

## 2020-08-02 LAB — AMB POC URINALYSIS DIP STICK AUTO W/O MICRO
Bilirubin (UA POC): NEGATIVE
Bilirubin, Urine, POC: NEGATIVE
Glucose (UA POC): NEGATIVE
Glucose, Urine, POC: NEGATIVE
Ketones (UA POC): NEGATIVE
Ketones, Urine, POC: NEGATIVE
Leukocyte Esterase, Urine, POC: NEGATIVE
Leukocyte esterase (UA POC): NEGATIVE
Nitrite, Urine, POC: NEGATIVE
Nitrites (UA POC): NEGATIVE
Protein (UA POC): NEGATIVE
Protein, Urine, POC: NEGATIVE
Specific Gravity, Urine, POC: 1.02 NA (ref 1.001–1.035)
Specific gravity (UA POC): 1.02 (ref 1.001–1.035)
Urobilinogen (UA POC): 0.2 (ref 0.2–1)
Urobilinogen, POC: 0.2 (ref 0.2–1)
pH (UA POC): 6.5 (ref 4.6–8.0)
pH, Urine, POC: 6.5 NA (ref 4.6–8.0)

## 2020-08-02 LAB — AMB POC PVR, MEAS,POST-VOID RES,US,NON-IMAGING
PVR POC: 0 cc
PVR: 0 cc

## 2020-08-02 NOTE — Progress Notes (Signed)
Faxed imaging to [facility]; [place] [phone] F/U; 01/31/21

## 2020-08-02 NOTE — Progress Notes (Signed)
Progress Notes by [name], PA-C at 08/02/20 1340                Author: [name], PA-C  Service: --  Author Type: Physician Assistant       Filed: 08/02/20 2239  Encounter Date: 08/02/2020  Status: Signed          Editor: [name], PA-C (Physician Assistant)                                    Monica Reyes   Jul 04, 1940                      ICD-10-CM  ICD-9-CM             1.  Kidney lesion   N28.9  593.9  AMB POC PVR, MEAS,POST-VOID RES,US,NON-IMAGING                AMB POC URINALYSIS DIP STICK AUTO W/O MICRO           [place] RETROPERITONEUM COMP           2.  OAB (overactive bladder)   N32.81  596.51  AMB POC PVR, MEAS,POST-VOID RES,US,NON-IMAGING                AMB POC URINALYSIS DIP STICK AUTO W/O MICRO           3.  Urinary frequency   R35.0  788.41       4.  Urinary urgency   R39.15  788.63             5.  Urge incontinence   N39.41  788.31             Assessment and Plan:   UA today trace blood    PVR today 0 cc        1. Kidney Lesion     Reviewed CT w/ Contrast 11/07/19 - Small calcification midpole left kidney may be vascular in origin also seen previously. Small angiomyolipoma midpole left kidney.  1.2 cm x 0.8 cm low-density indeterminate lesion lower pole right kidney mildly increased in size since 01/04/2008 (0.9 cm x 0.7 cm) but unchanged since 11/22/2018.   ??  Reviewed last creatinine 03/19/20 - 0.7 ng/dL (WNL)              Patient Asymptomatic. No CVA tenderness on exam today.               Likely benign. Reassured patient.               Will continue to monitor. Plan for RUS in 6 months.         2. Angiomyolipoma of L Kidney               Benign, no further workup necessary. Reassured patient.       3. OAB/ Frequency / Urgency / UUI                Patient is not so bothered by. Uninterested in medications.                 Discussed behavioral modifications to limit OAB sxs such as limiting bladder irritants in diet, timed voiding, stopping fluid intake 1-2 hours prior to  sleep, elevating LE in the evening prior to sleep to help take off bodily fluid, and voiding  right before sleep.  Literature given.       4. 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       Body mass index is 31.16 kg/m??.       DISCUSSION:   Today we reviewed the films with the patient and their history and discussed treatment for renal masses less than 4 cm in size. We discussed the data surrounding these lesions and the fact that partial  nephrectomy vs. radical nephrectomy provide the same oncologic outcomes. We discussed the fact for tumors less than 4 cm in size, open or laparoscopic partial nephrectomy are the standard of care. We also discussed the fact that the ablative therapies  have also become an option for care of these lesions and specifically that cryoablation provides comparable outcomes when compared to partial nephrectomy however long-term data (greater than 10 years) is currently lacking.  There is certainly good 5-year  data noting that efficacy is quite comparable and that this is a viable treatment alternative for these lesions.  When evaluating the pooled literature, the efficacy rates for cryoablation are decreased by about 5-7% when compared to partial nephrectomy.   Our laparoscopic data (excluding percutaneous data) has comparable efficacy to partial nephrectomy.  We discussed the risks and complications of all treatment modalities: open partial nephrectomy, laparoscopic partial nephrectomy and cryoablation of  the renal lesion.  We also discussed radiofrequency ablation which we do not perform. We discussed our data surrounding ablative therapy at our institution as well and all risks and complications once again have been discussed.  The patient has been fully  informed.      We discussed management options for patient's renal tumor including observation, nephrectomy, partial nephrectomy -- lap or open, and cryoablation.  I recommend robotic partial nephrectomy  and the tumor looks ammenable to robotic approach.  R/B discussed  including infection, bleeding, transfusion, urine leak, higher complication rate with partial nephrectomy, possible conversion to open partial and/or total nephrectomy with subsequent diminished preservation of renal function, possible injury to surrounding  organs, possible benign path or positive margins, and global anesthesia risks including but not limited to CVA, MI, DVT, PE, pneumonia, and death. The patient understands and desires to proceed.       All questions during today's visit were answered.               Chief Complaint       Patient presents with        ?  Other             kidney leision              HISTORY OF PRESENT ILLNESS:       Monica Reyes is a 80 y.o.  female who presents today in consultation for renal lesion and has been referred by  Gaye Pollack, MD. CT Chest/AP 11/07/19 revealed a small calcification midpole left kidney may be vascular in origin also seen previously. Small angiomyolipoma midpole left kidney. 1.2 cm x 0.8 cm  low-density indeterminate lesion lower pole right kidney mildly increased in size. Last Cr 03/19/20 0.7.        Today, the patient is doing well.   Flank pain or renal colic: NO   F/c/n/v: NO    Gross hematuria, dysuria: NO   Symptomatic for urinary infection: NO   Significant h/o UTI's: NO       No bothersome urinary symptoms.  FOS strong    Notes frequency, urgency, and occasional UUI she is not bothered by.    Denies straining to void.       FH of renal cancer: NO   FH of bladder cancer of brother.        Patient is not a current smoker.           Past Medical History:        Diagnosis  Date         ?  A-fib (HCC)       ?  GERD (gastroesophageal reflux disease)       ?  HTN (hypertension)       ?  Pulmonary embolus (HCC)           ?  Thyroid disease               Past Surgical History:         Procedure  Laterality  Date          ?  HX BREAST LUMPECTOMY         ?  HX CHOLECYSTECTOMY          ?  HX HEART CATHETERIZATION         ?  HX OTHER SURGICAL              cardiac ablation          ?  HX TUBAL LIGATION                History reviewed. No pertinent family history.        Current Outpatient Medications          Medication  Sig  Dispense  Refill           ?  amitriptyline (ELAVIL) 10 mg tablet  TAKE 3 TABLETS EVERY NIGHT AT BEDTIME FOR HEADACHE         ?  apixaban (Eliquis) 5 mg tablet  1 Tablet two (2) times a day.         ?  atorvastatin (LIPITOR) 10 mg tablet           ?  dofetilide (TIKOSYN) 250 mcg capsule  TAKE 1 CAPSULE EVERY 12 HOURS         ?  gabapentin (NEURONTIN) 300 mg capsule  TAKE 1 CAPSULE EVERY MORNING, TAKE 1 CAPSULE AT NOON AND TAKE 2 CAPSULES  AT BEDTIME         ?  levothyroxine (SYNTHROID) 75 mcg tablet           ?  metoprolol succinate (TOPROL-XL) 25 mg XL tablet                 ?  PARoxetine (PAXIL) 20 mg tablet  TAKE 1 TABLET DAILY FOR ANXIETY               Allergies:     Allergies        Allergen  Reactions         ?  Latex, Natural Rubber  Itching             Skin breakage         ?  Sulfa (Sulfonamide Antibiotics)  Angioedema             Social History          Socioeconomic History         ?  Marital status:  WIDOWED  Spouse name:  Not on file         ?  Number of children:  Not on file     ?  Years of education:  Not on file     ?  Highest education level:  Not on file       Occupational History        ?  Not on file       Tobacco Use         ?  Smoking status:  Never Smoker     ?  Smokeless tobacco:  Never Used       Substance and Sexual Activity         ?  Alcohol use:  Not Currently     ?  Drug use:  Never     ?  Sexual activity:  Not on file        Other Topics  Concern        ?  Not on file       Social History Narrative        ?  Not on file          Social Determinants of Health          Financial Resource Strain:         ?  Difficulty of Paying Living Expenses: Not on file       Food Insecurity:         ?  Worried About Running Out of Food in the Last  Year: Not on file     ?  Ran Out of Food in the Last Year: Not on file       Transportation Needs:         ?  Lack of Transportation (Medical): Not on file     ?  Lack of Transportation (Non-Medical): Not on file       Physical Activity:         ?  Days of Exercise per Week: Not on file     ?  Minutes of Exercise per Session: Not on file       Stress:         ?  Feeling of Stress : Not on file       Social Connections:         ?  Frequency of Communication with Friends and Family: Not on file     ?  Frequency of Social Gatherings with Friends and Family: Not on file     ?  Attends Religious Services: Not on file     ?  Active Member of Clubs or Organizations: Not on file     ?  Attends Banker Meetings: Not on file     ?  Marital Status: Not on file       Intimate Partner Violence:         ?  Fear of Current or Ex-Partner: Not on file     ?  Emotionally Abused: Not on file     ?  Physically Abused: Not on file     ?  Sexually Abused: Not on file       Housing Stability:         ?  Unable to Pay for Housing in the Last Year: Not on file        ?  Number of Places Lived in the Last Year: Not on file        ?  Unstable Housing in the Last Year: Not on file              Review of Systems   Constitutional: Fever: No   Skin: Rash: No   HEENT: Hearing difficulty: No   Eyes: Blurred vision: No   Cardiovascular: Chest pain: No   Respiratory: Shortness of breath: No   Gastrointestinal: Nausea/vomiting: No   Musculoskeletal: Back pain: No   Neurological: Weakness: No   Psychological: Memory loss: No   Comments/additional findings:          PHYSICAL EXAMINATION:       Visit Vitals      Ht  5\' 9"  (1.753 m)     Wt  211 lb (95.7 kg)        BMI  31.16 kg/m??        Constitutional: Well developed, well-nourished female in no acute distress.    CV:  No peripheral swelling noted   Respiratory: No respiratory distress or difficulties   Abdomen:  Soft and nontender. No CVA tenderness.    Skin:  Normal color. No evidence of  jaundice.      Neuro/Psych:  Patient with appropriate affect.  Alert and oriented.     Lymphatic:   No enlargement of supraclavicular lymph nodes.            REVIEW OF LABS AND IMAGING:          Results for orders placed or performed in visit on 08/02/20     AMB POC PVR, MEAS,POST-VOID RES,US,NON-IMAGING         Result  Value  Ref Range            PVR  0  cc       AMB POC URINALYSIS DIP STICK AUTO W/O MICRO         Result  Value  Ref Range            Color (UA POC)  Yellow         Clarity (UA POC)  Clear         Glucose (UA POC)  Negative  Negative       Bilirubin (UA POC)  Negative  Negative       Ketones (UA POC)  Negative  Negative       Specific gravity (UA POC)  1.020  1.001 - 1.035       Blood (UA POC)  Trace  Negative       pH (UA POC)  6.5  4.6 - 8.0       Protein (UA POC)  Negative  Negative       Urobilinogen (UA POC)  0.2 mg/dL  0.2 - 1       Nitrites (UA POC)  Negative  Negative            Leukocyte esterase (UA POC)  Negative  Negative        CT Chest/AP w/ Contrast 11/07/19   FINDINGS   ADRENALS: Normal.   ??   KIDNEYS/URETERS/BLADDER: Small calcification midpole left kidney may be vascular in origin also seen previously. Small angiomyolipoma midpole left kidney. 1.2 cm x 0.8 cm low-density indeterminate lesion lower pole right  kidney mildly increased in size since 01/04/2008 (0.9 cm x 0.7 cm) but unchanged since 11/22/2018.   ??   PELVIC ORGANS: Uterus intact. Pelvic calcifications are likely phleboliths.          IMPRESSION   Large hiatal hernia. Duodenal diverticula. Stable  angiomyolipoma left kidney.   Small indeterminate low-density lesion lower pole right kidney unchanged since 11/22/2018 and slightly increased since 01/04/2008 likely benign.   Other incidental findings  as described above.       No results found for: PSA, Normajean Glasgow, Aliene Altes, [id number], [id number]         A copy of today's office visit with all pertinent imaging results and labs were sent to the referring  physician,Vaughn, Elon Spanner, MD      [name], PA-C   [facility]    [address] [place], [address]   [place], [place] [address]   P: [phone]    F: [phone]

## 2020-08-02 NOTE — Progress Notes (Signed)
Imaging placed in future folder.  Imaging will be sent out 1 month prior to f/u appt unless noted otherwise         RUS, needed 01-31-2021

## 2020-08-02 NOTE — Progress Notes (Signed)
Monica Reyes has an order for RUS      ALL FEMALE PATIENTS NEEDING AN MRI OF PELVIS OR PROSTATE, MUST BE SCHEDULED AT ONE OF THE FOLLOWING:    **MRI/CT (Preferred [facility])  **[facility] (Preferred [facility])  **[facility] (Preferred [place])  [facility]  [facility]      To be done at [facility]    Needed by: Prior to appointment    Patient has a follow-up appointment:  Yes 01/31/2021    If MRI, does patient have a pacemaker:  NA    Order has been placed in connect care:  Yes     Is this a STAT order:   No       Marijo Sanes

## 2021-01-24 ENCOUNTER — Encounter

## 2021-01-24 NOTE — Telephone Encounter (Signed)
Call from pt stating that she has an order for an ultrasound and [facility] central scheduling told her that the wrong dx code is on the order and that it needs to be corrected before she can schedule.  [phone]

## 2021-01-28 NOTE — Telephone Encounter (Signed)
Pt informed that imaging was fixed and faxed last week. Pt thanked me and call was ended.

## 2021-01-31 ENCOUNTER — Encounter: Attending: Physician Assistant | Primary: Family Medicine

## 2021-02-16 ENCOUNTER — Encounter

## 2021-02-16 NOTE — Progress Notes (Signed)
RUS looks good. Follow up as scheduled to review in detail.

## 2021-02-16 NOTE — Progress Notes (Signed)
Letter sent with results

## 2021-03-13 ENCOUNTER — Encounter: Attending: Physician Assistant | Primary: Family Medicine

## 2021-03-21 NOTE — Telephone Encounter (Signed)
Received a letter from patient today saying she had to cancel her 12/9 appt but has beed trying for 3 weeks to get through via phone and was unsuccessful.  I looked in [place] and I think the problem was resolved sonce the 12/9 appt was cancelled and rescheduled to 04/30/21 with PA Tyler Deis.  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

## 2021-03-22 ENCOUNTER — Encounter: Attending: Physician Assistant | Primary: Family Medicine

## 2021-04-30 ENCOUNTER — Ambulatory Visit: Admit: 2021-04-30 | Discharge: 2021-04-30 | Payer: MEDICARE | Attending: Physician Assistant | Primary: Family Medicine

## 2021-04-30 ENCOUNTER — Ambulatory Visit: Attending: Physician Assistant | Primary: Family Medicine

## 2021-04-30 DIAGNOSIS — N281 Cyst of kidney, acquired: Secondary | ICD-10-CM

## 2021-04-30 LAB — AMB POC URINALYSIS DIP STICK AUTO W/O MICRO
Bilirubin (UA POC): NEGATIVE
Bilirubin, Urine, POC: NEGATIVE
Glucose (UA POC): NEGATIVE
Glucose, Urine, POC: NEGATIVE
Ketones (UA POC): NEGATIVE
Ketones, Urine, POC: NEGATIVE
Nitrite, Urine, POC: NEGATIVE
Nitrites (UA POC): NEGATIVE
Protein (UA POC): NEGATIVE
Protein, Urine, POC: NEGATIVE
Specific Gravity, Urine, POC: 1.015 NA (ref 1.001–1.035)
Specific gravity (UA POC): 1.015 (ref 1.001–1.035)
Urobilinogen (UA POC): 0.2 (ref 0.2–1)
Urobilinogen, POC: 0.2 (ref 0.2–1)
pH (UA POC): 5.5 (ref 4.6–8.0)
pH, Urine, POC: 5.5 NA (ref 4.6–8.0)

## 2021-04-30 NOTE — Progress Notes (Signed)
Progress Notes by [name], PA-C at 04/30/21 1140                Author: [name], PA-C  Service: --  Author Type: Physician Assistant       Filed: 05/01/21 0856  Encounter Date: 04/30/2021  Status: Signed          Editor: [name], PA-C (Physician Assistant)                                    Despina Pole Keeling   Apr 27, 1940                      ICD-10-CM  ICD-9-CM             1.  Renal cyst   N28.1  753.10  AMB POC URINALYSIS DIP STICK AUTO W/O MICRO                     Assessment and Plan:   UA today trace blood, trace leuks       1. R 13 mm Renal Cyst       Reviewed CT w/ Contrast 11/07/19 - Small calcification midpole left kidney may be vascular in origin also seen previously. Small angiomyolipoma midpole left kidney. 1.2  cm x 0.8 cm low-density indeterminate lesion lower pole right kidney mildly increased in size since 01/04/2008 (0.9 cm x 0.7 cm) but unchanged since 11/22/2018.         Reviewed RUS 01/24/21 - Left angiomyolipoma and LP simple right renal cyst 13 mm.          Reviewed last creatinine 11/22 - 0.8 ng/dL (WNL)                 Patient Asymptomatic. No CVA tenderness on exam today.                  Findings are benign, and do not require further workup. Reassured patient.        2. Angiomyolipoma of L Kidney               Benign, no further workup necessary. Reassured patient.       3. OAB/ Frequency / Urgency / UUI                Patient is not so bothered by. Uninterested in medications.                 Discussed behavioral modifications to limit OAB sxs such as limiting bladder irritants in diet, timed voiding, stopping fluid intake 1-2 hours prior to sleep, elevating LE in the evening prior to sleep to help take off bodily fluid, and voiding  right before sleep. Literature given.       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       Body mass index is 28.24 kg/m??.                 Chief Complaint       Patient presents with        ?  Renal Cyst                  HISTORY OF PRESENT ILLNESS:       Monica Reyes is a 81 y.o.  female who presents today in follow up of  renal lesion found on CT 10/2019. CT Chest/AP 11/07/19 revealed a small calcification midpole left kidney  may be vascular in origin also seen previously. Small angiomyolipoma midpole left kidney. 1.2 cm x 0.8 cm low-density indeterminate lesion lower pole right kidney mildly increased in size. Repeat RUS 01/24/21 revealed a left angiomyolipoma and LP simple  right renal cyst 13 mm. Last Cr 7/22 0.8.        Today, the patient is doing well.   Flank pain or renal colic: NO   F/c/n/v: NO    Gross hematuria, dysuria: NO   Symptomatic for urinary infection: NO   Significant h/o UTI's: NO       No bothersome urinary symptoms.    FOS strong    Notes frequency, urgency, and occasional UUI she is not bothered by.    Denies straining to void.       FH of renal cancer: NO   FH of bladder cancer of brother.        Patient is not a current smoker.      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            Past Medical History:        Diagnosis  Date         ?  A-fib (HCC)       ?  GERD (gastroesophageal reflux disease)       ?  HTN (hypertension)       ?  Pulmonary embolus (HCC)           ?  Thyroid disease               Past Surgical History:         Procedure  Laterality  Date          ?  HX BREAST LUMPECTOMY         ?  HX CHOLECYSTECTOMY         ?  HX HEART CATHETERIZATION         ?  HX OTHER SURGICAL              cardiac ablation          ?  HX TUBAL LIGATION                No family history on file.        Current Outpatient Medications          Medication  Sig  Dispense  Refill           ?  acetaminophen (TYLENOL) 500 mg tablet  Take 250-500 mg by mouth every four (4) hours as needed.         ?  acetaminophen-codeine (TYLENOL #3) 300-30 mg per tablet  take 1 tablet by mouth every 4 hours if needed for MILD PAIN ( PAIN SCALE 1-3 )         ?  fluticasone propionate (FLONASE) 50 mcg/actuation nasal spray  2 Sprays by Nasal route  daily.         ?  meclizine (ANTIVERT) 25 mg tablet  take 1 tablet by mouth every 8 hours if needed         ?  SUMAtriptan (IMITREX) 100 mg tablet  Take 100 mg by mouth daily as needed.         ?  apixaban (ELIQUIS) 5 mg tablet  1 Tablet two (2) times a day.         ?  atorvastatin (LIPITOR) 10 mg tablet           ?  dofetilide (TIKOSYN) 250 mcg capsule  TAKE 1 CAPSULE EVERY 12 HOURS         ?  gabapentin (NEURONTIN) 300 mg capsule  TAKE 1 CAPSULE EVERY MORNING, TAKE 1 CAPSULE AT NOON AND TAKE 2 CAPSULES  AT BEDTIME         ?  levothyroxine (SYNTHROID) 75 mcg tablet           ?  clindamycin (CLEOCIN) 300 mg capsule  Take 300 mg by mouth two (2) times a day. (Patient not taking: Reported on 04/30/2021)         ?  amitriptyline (ELAVIL) 10 mg tablet  TAKE 3 TABLETS EVERY NIGHT AT BEDTIME FOR HEADACHE (Patient not taking: Reported on 04/30/2021)         ?  metoprolol succinate (TOPROL-XL) 25 mg XL tablet   (Patient not taking: Reported on 04/30/2021)               ?  PARoxetine (PAXIL) 20 mg tablet  TAKE 1 TABLET DAILY FOR ANXIETY (Patient not taking: Reported on 04/30/2021)               Allergies:     Allergies        Allergen  Reactions         ?  Latex, Natural Rubber  Itching             Skin breakage         ?  Sulfa (Sulfonamide Antibiotics)  Angioedema             Social History          Socioeconomic History         ?  Marital status:  WIDOWED              Spouse name:  Not on file         ?  Number of children:  Not on file     ?  Years of education:  Not on file     ?  Highest education level:  Not on file       Occupational History        ?  Not on file       Tobacco Use         ?  Smoking status:  Never     ?  Smokeless tobacco:  Never       Substance and Sexual Activity         ?  Alcohol use:  Not Currently     ?  Drug use:  Never     ?  Sexual activity:  Not on file        Other Topics  Concern        ?  Not on file       Social History Narrative        ?  Not on file          Social Determinants of Health           Financial Resource Strain: Not on file     Food Insecurity: Not on file     Transportation Needs: Not on file     Physical Activity: Not on file     Stress: Not on file     Social Connections: Not on file     Intimate Partner Violence: Not  on file       Housing Stability: Not on file              Review of Systems   Constitutional: Fever: No   Skin: Rash: No   HEENT: Hearing difficulty: No   Eyes: Blurred vision: No   Cardiovascular: Chest pain: Yes   Respiratory: Shortness of breath: Yes   Gastrointestinal: Nausea/vomiting: No   Musculoskeletal: Back pain: No   Neurological: Weakness: No   Psychological: Memory loss: No   Comments/additional findings:          PHYSICAL EXAMINATION:       Visit Vitals      Ht  5' 9.5" (1.765 m)     Wt  194 lb (88 kg)        BMI  28.24 kg/m??           Constitutional: Well developed, well-nourished female in no acute distress.    CV:  No peripheral swelling noted   Respiratory: No respiratory distress or difficulties   Abdomen:  Soft and nontender. No CVA tenderness.    Skin:  Normal color. No evidence of jaundice.      Neuro/Psych:  Patient with appropriate affect.  Alert and oriented.     Lymphatic:   No enlargement of supraclavicular lymph nodes.            REVIEW OF LABS AND IMAGING:          Results for orders placed or performed in visit on 04/30/21     AMB POC URINALYSIS DIP STICK AUTO W/O MICRO         Result  Value  Ref Range            Color (UA POC)  Yellow         Clarity (UA POC)  Clear         Glucose (UA POC)  Negative  Negative       Bilirubin (UA POC)  Negative  Negative       Ketones (UA POC)  Negative  Negative       Specific gravity (UA POC)  1.015  1.001 - 1.035       Blood (UA POC)  Trace  Negative       pH (UA POC)  5.5  4.6 - 8.0       Protein (UA POC)  Negative  Negative       Urobilinogen (UA POC)  0.2 mg/dL  0.2 - 1       Nitrites (UA POC)  Negative  Negative            Leukocyte esterase (UA POC)  Trace  Negative        RUS 01/24/21   FINDINGS    RIGHT KIDNEY: 12.4 cm. No hydronephrosis or solid renal mass. Lower pole cyst measuring up to 13 mm. No visible calculi. Normal renal echotexture and cortical thickness.     LEFT KIDNEY: 12.0 cm. No hydronephrosis. Hyperechoic nodule within the  interpolar region measuring up to 23 mm, previously 13 mm. No visible calculi. Normal renal echotexture and cortical thickness.     BLADDER: No intraluminal calculi or debris. No wall thickening. Only a right ureteral jet was demonstrated. Left  ureteral jet not identified, however likely technical. Prevoid volume of 366 mL with a post void residual of 5 mL     OTHER: None.       IMPRESSION     1. Hyperechoic interpolar  left renal lesion likely representing angiomyolipoma, increased in size since prior CT (23 mm, previously 13 mm).     2. Lower pole right renal cyst (13 mm).     3. Left ureteral jet was not  demonstrated however likely technical. Otherwise unremarkable bladder.        CT Chest/AP w/ Contrast 11/07/19   FINDINGS   ADRENALS: Normal.       KIDNEYS/URETERS/BLADDER: Small calcification midpole left kidney may be vascular in origin also seen previously. Small angiomyolipoma midpole left kidney. 1.2 cm x 0.8 cm low-density indeterminate lesion lower pole right  kidney mildly increased in size since 01/04/2008 (0.9 cm x 0.7 cm) but unchanged since 11/22/2018.       PELVIC ORGANS: Uterus intact. Pelvic calcifications are likely phleboliths.          IMPRESSION   Large hiatal hernia. Duodenal diverticula. Stable angiomyolipoma left kidney.   Small indeterminate low-density lesion lower pole right kidney unchanged since 11/22/2018 and slightly increased since 01/04/2008 likely benign.   Other incidental findings  as described above.       No results found for: PSA, Normajean GlasgowSA2, Aliene AltesSAR1, PSA1, PSAR2, PSA3, PSAR3, [id number], [id number]         A copy of today's office visit with all pertinent imaging results and labs were sent to the referring physician,Vaughn, Elon SpannerLindsey D, MD       Reather Converseayler Demonta Wombles, PA-C   [facility]    [address]   [place], [place] [id number]   P: [phone]    F: [phone]
